# Patient Record
Sex: Female | Born: 1965 | Race: Black or African American | Hispanic: No | Marital: Married | Smoking: Current every day smoker
Health system: Southern US, Community
[De-identification: ages and names within clinical notes are randomized; demographics above are authoritative.]

## PROBLEM LIST (undated history)

## (undated) DIAGNOSIS — N281 Cyst of kidney, acquired: Secondary | ICD-10-CM

## (undated) HISTORY — PX: LAPAROSCOPIC DRAINAGE RENAL CYST: SUR764

---

## 1998-04-01 ENCOUNTER — Emergency Department (HOSPITAL_COMMUNITY): Admission: EM | Admit: 1998-04-01 | Discharge: 1998-04-01 | Payer: Self-pay | Admitting: Emergency Medicine

## 1999-06-26 ENCOUNTER — Emergency Department (HOSPITAL_COMMUNITY): Admission: EM | Admit: 1999-06-26 | Discharge: 1999-06-26 | Payer: Self-pay | Admitting: Emergency Medicine

## 1999-06-26 ENCOUNTER — Encounter: Payer: Self-pay | Admitting: Emergency Medicine

## 2000-01-23 ENCOUNTER — Other Ambulatory Visit: Admission: RE | Admit: 2000-01-23 | Discharge: 2000-01-23 | Payer: Self-pay | Admitting: Gynecology

## 2000-09-20 ENCOUNTER — Encounter: Payer: Self-pay | Admitting: *Deleted

## 2000-09-20 ENCOUNTER — Ambulatory Visit (HOSPITAL_COMMUNITY): Admission: RE | Admit: 2000-09-20 | Discharge: 2000-09-20 | Payer: Self-pay | Admitting: *Deleted

## 2001-06-10 ENCOUNTER — Other Ambulatory Visit: Admission: RE | Admit: 2001-06-10 | Discharge: 2001-06-10 | Payer: Self-pay | Admitting: Gynecology

## 2001-06-17 ENCOUNTER — Emergency Department (HOSPITAL_COMMUNITY): Admission: EM | Admit: 2001-06-17 | Discharge: 2001-06-17 | Payer: Self-pay | Admitting: Emergency Medicine

## 2004-04-11 ENCOUNTER — Other Ambulatory Visit: Admission: RE | Admit: 2004-04-11 | Discharge: 2004-04-11 | Payer: Self-pay | Admitting: Gynecology

## 2005-12-14 ENCOUNTER — Other Ambulatory Visit: Admission: RE | Admit: 2005-12-14 | Discharge: 2005-12-14 | Payer: Self-pay | Admitting: Gynecology

## 2006-12-23 ENCOUNTER — Emergency Department (HOSPITAL_COMMUNITY): Admission: EM | Admit: 2006-12-23 | Discharge: 2006-12-23 | Payer: Self-pay | Admitting: Emergency Medicine

## 2008-11-23 ENCOUNTER — Emergency Department (HOSPITAL_COMMUNITY): Admission: EM | Admit: 2008-11-23 | Discharge: 2008-11-24 | Payer: Self-pay | Admitting: Emergency Medicine

## 2008-12-17 ENCOUNTER — Encounter: Admission: RE | Admit: 2008-12-17 | Discharge: 2008-12-17 | Payer: Self-pay | Admitting: Urology

## 2009-02-02 ENCOUNTER — Ambulatory Visit (HOSPITAL_COMMUNITY): Admission: RE | Admit: 2009-02-02 | Discharge: 2009-02-02 | Payer: Self-pay | Admitting: Urology

## 2009-12-15 IMAGING — CT CT PELVIS W/O CM
3 of 4 series · 14 of 32 positions shown, 19 images · non-contrast
Comparison: None

CT ABDOMEN

CLINICAL DATA: Left flank pain.

CT ABDOMEN AND PELVIS WITHOUT CONTRAST
TECHNIQUE: Multidetector CT imaging of the abdomen and pelvis was
performed following the standard protocol without intravenous
contrast.

[Series 2: renal stone · axial · 0.79mm/px · z∈[-362,-122]mm · 4 of 81 slices shown, 9 images]
[im 17/81  soft-tissue]
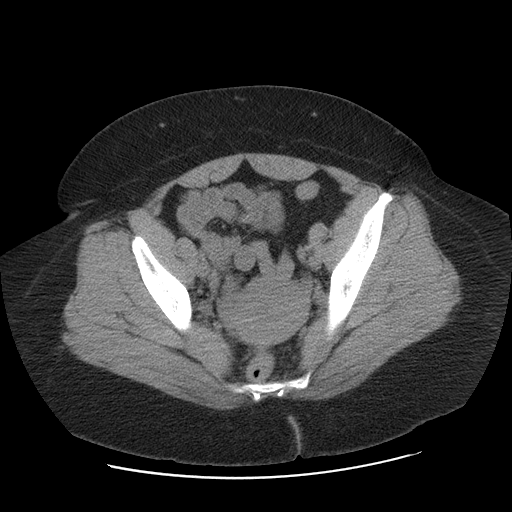
[im 17/81  lung]
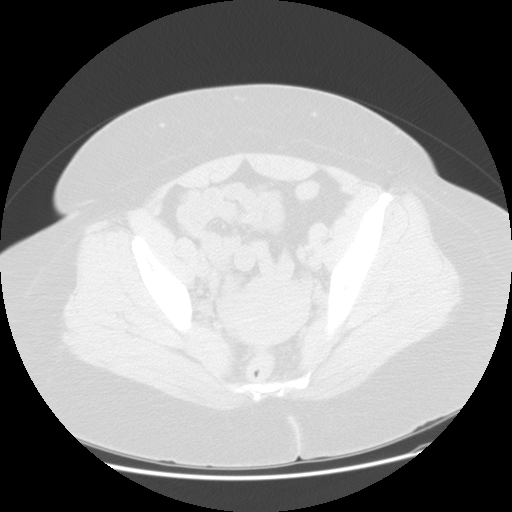
[im 17/81  bone]
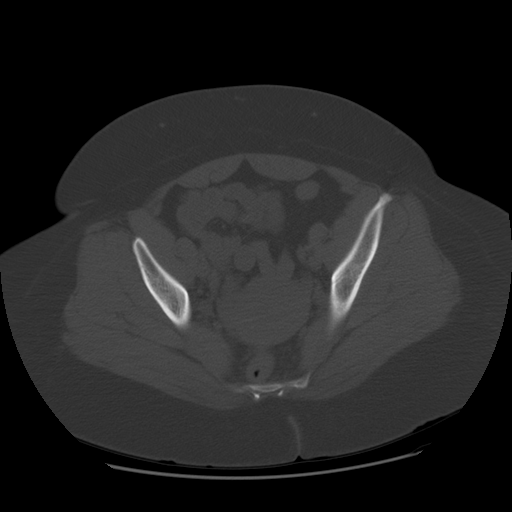
[im 33/81  soft-tissue]
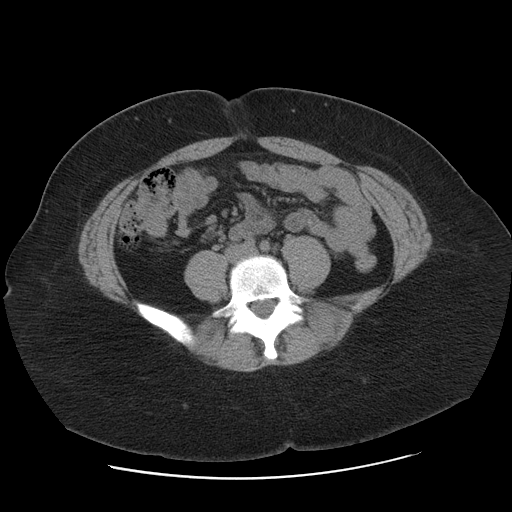
[im 33/81  lung]
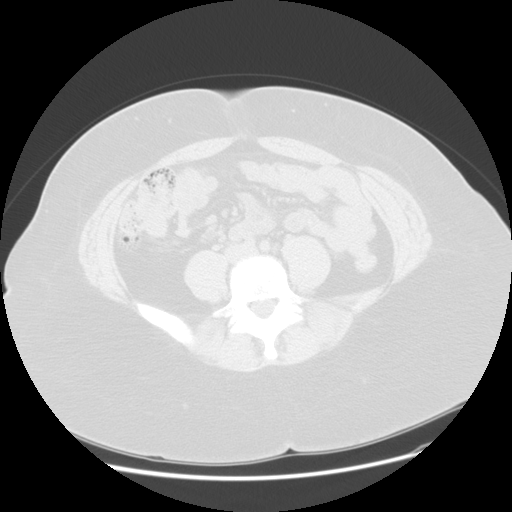
[im 49/81  soft-tissue]
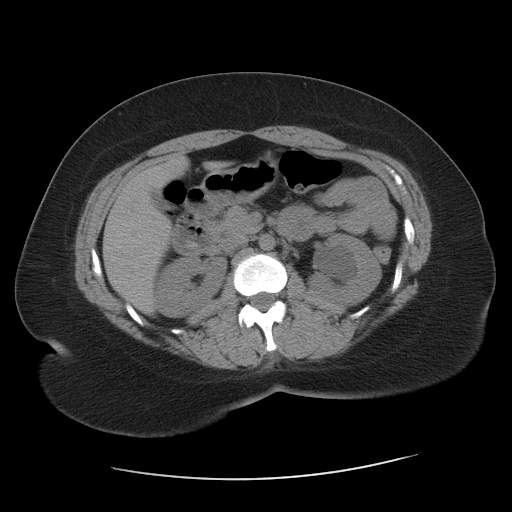
[im 49/81  lung]
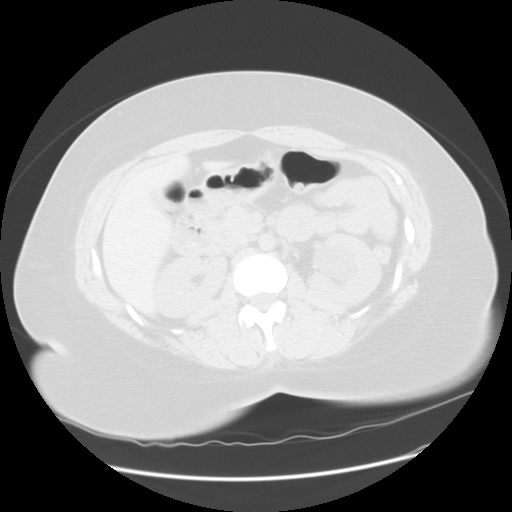
[im 65/81  soft-tissue]
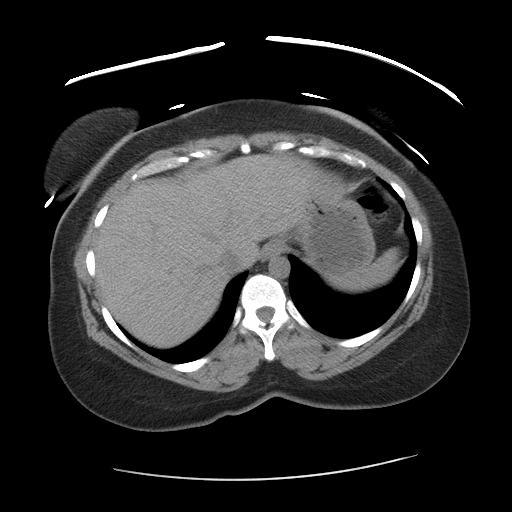
[im 65/81  lung]
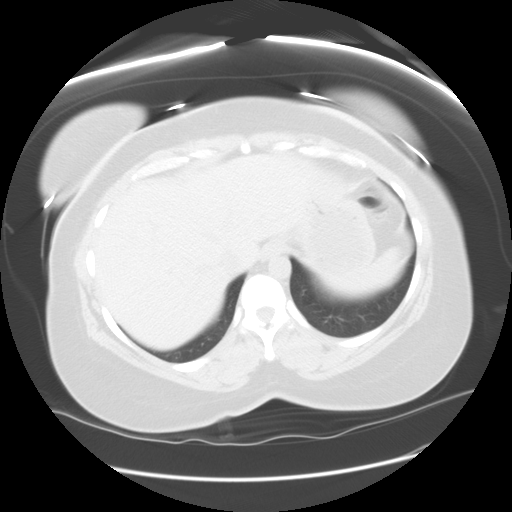

[Series 400: reformatted · sagittal · 0.87mm/px · 8 of 195 slices shown (1 of 2)]
[im 17/195  soft-tissue]
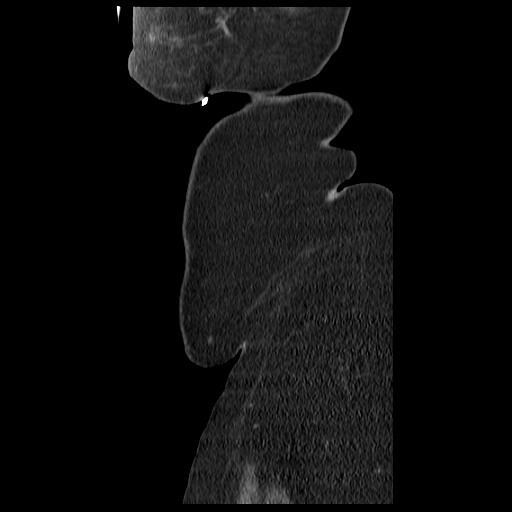
[im 49/195  soft-tissue]
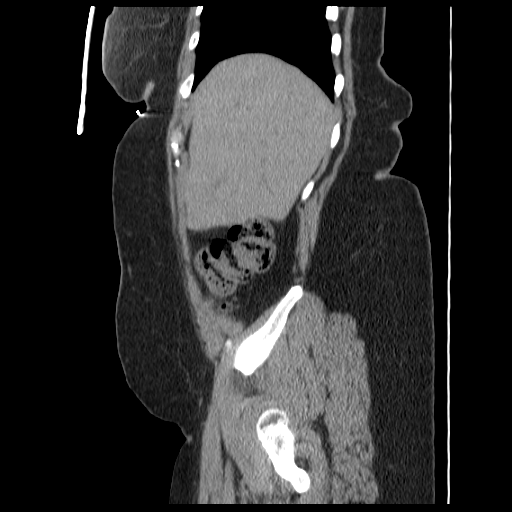
[im 65/195  soft-tissue]
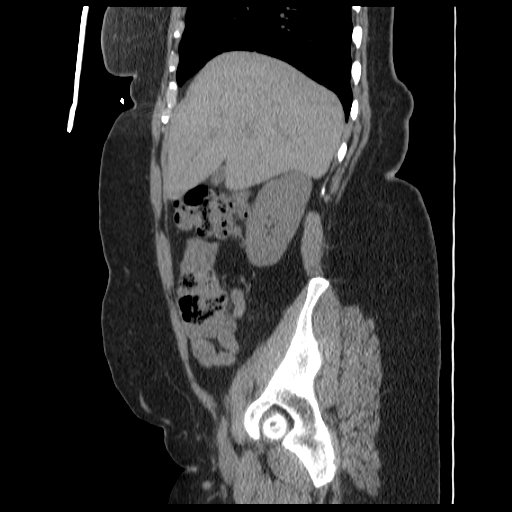
[im 81/195  soft-tissue]
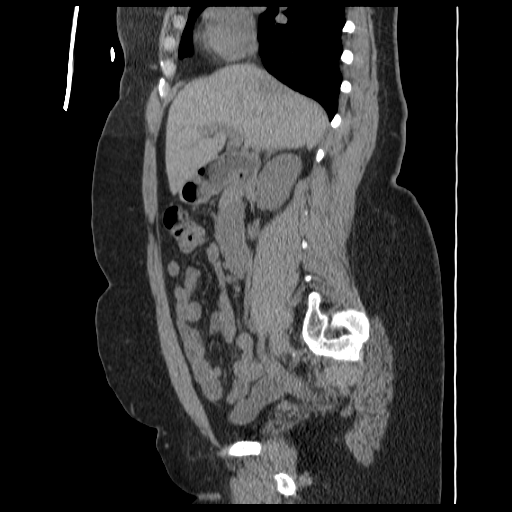
[im 114/195  soft-tissue]
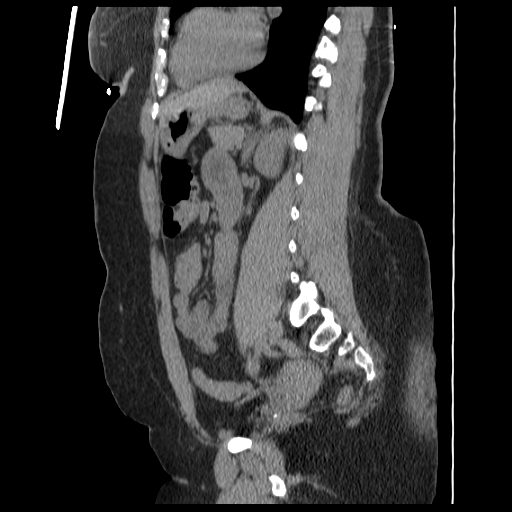
[im 130/195  soft-tissue]
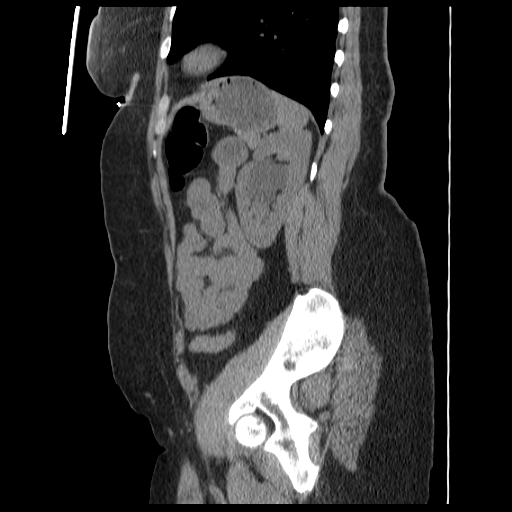
[im 146/195  soft-tissue]
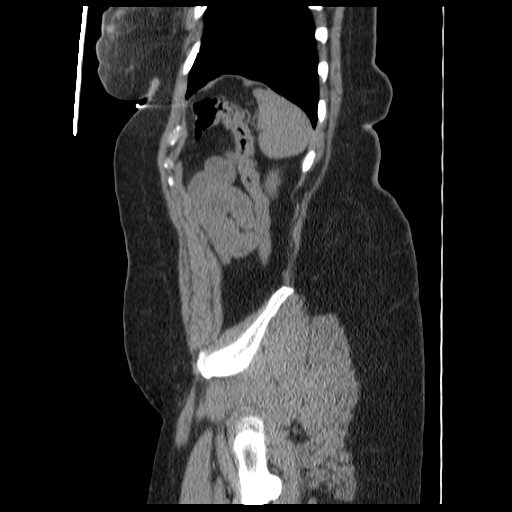
[im 178/195  soft-tissue]
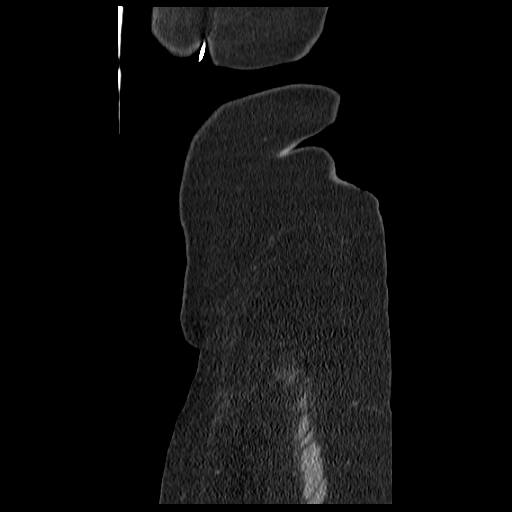

[Series 401: reformatted · coronal · 0.87mm/px · 2 of 171 slices shown (2 of 2)]
[im 18/171  soft-tissue]
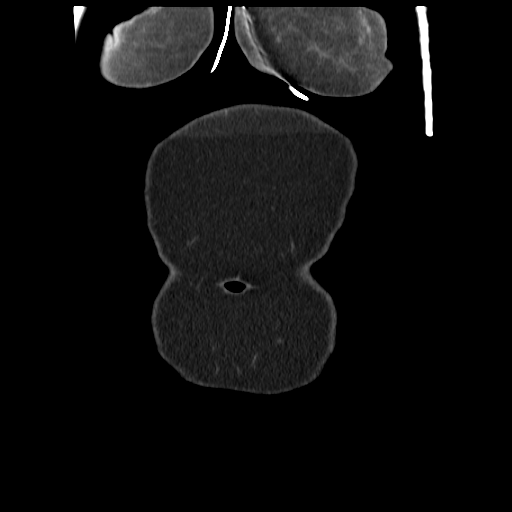
[im 35/171  soft-tissue]
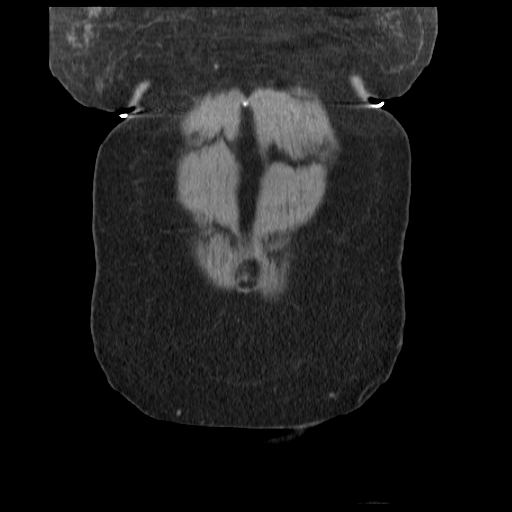

[14 of 32 positions shown; findings below may reference images not displayed]

FINDINGS: Visualized lung bases clear.  Unremarkable uninfused
evaluation of the liver, nondistended gallbladder, spleen, adrenal
glands, pancreas, small bowel, right kidney.  There is a 3.8 cm
fluid attenuation left parapelvic renal mass probably cyst but
incompletely characterized.  No hydronephrosis.  No
nephrolithiasis.
IMPRESSION: 1.  3.8 cm left parapelvic renal mass probably cyst but
incompletely characterized. Outpatient ultrasound or dedicated
renal MR Je be useful for confirmation.
2.  Negative for acute abdominal process.

CT PELVIS
FINDINGS: Normal appendix.  The colon is nondilated, unremarkable.
Bilateral pelvic phleboliths.  No definite distal ureteral
calculus.  Urinary bladder is nondistended.  Uterus and adnexal
regions unremarkable.  Asymmetric sacroiliitis, left greater than
right.  No free fluid.  No definite adenopathy.
IMPRESSION: 1.  No acute pelvic process.
2.  Normal appendix.

## 2010-09-02 LAB — PROTIME-INR: INR: 1 (ref 0.00–1.49)

## 2010-09-02 LAB — CBC
HCT: 42.4 % (ref 36.0–46.0)
Platelets: 253 10*3/uL (ref 150–400)
RDW: 13.9 % (ref 11.5–15.5)

## 2010-09-02 LAB — APTT: aPTT: 24 seconds (ref 24–37)

## 2010-09-05 LAB — URINE MICROSCOPIC-ADD ON

## 2010-09-05 LAB — DIFFERENTIAL
Basophils Absolute: 0.1 10*3/uL (ref 0.0–0.1)
Eosinophils Relative: 3 % (ref 0–5)
Lymphocytes Relative: 27 % (ref 12–46)

## 2010-09-05 LAB — URINALYSIS, ROUTINE W REFLEX MICROSCOPIC
Glucose, UA: NEGATIVE mg/dL
Leukocytes, UA: NEGATIVE
Nitrite: NEGATIVE
Protein, ur: NEGATIVE mg/dL
Urobilinogen, UA: 0.2 mg/dL (ref 0.0–1.0)

## 2010-09-05 LAB — WET PREP, GENITAL: Yeast Wet Prep HPF POC: NONE SEEN

## 2010-09-05 LAB — POCT PREGNANCY, URINE: Preg Test, Ur: NEGATIVE

## 2010-09-05 LAB — POCT I-STAT, CHEM 8
BUN: 14 mg/dL (ref 6–23)
Hemoglobin: 15.3 g/dL — ABNORMAL HIGH (ref 12.0–15.0)
Sodium: 140 mEq/L (ref 135–145)
TCO2: 26 mmol/L (ref 0–100)

## 2010-09-05 LAB — URINE CULTURE: Colony Count: >=100000

## 2010-09-05 LAB — CBC
HCT: 42.7 % (ref 36.0–46.0)
Platelets: 225 10*3/uL (ref 150–400)
RDW: 12.8 % (ref 11.5–15.5)

## 2013-03-23 ENCOUNTER — Emergency Department (INDEPENDENT_AMBULATORY_CARE_PROVIDER_SITE_OTHER)
Admission: EM | Admit: 2013-03-23 | Discharge: 2013-03-23 | Disposition: A | Discharge status: Home / Self Care | Payer: Self-pay | Source: Home / Self Care | Attending: Family Medicine | Admitting: Family Medicine

## 2013-03-23 ENCOUNTER — Encounter (HOSPITAL_COMMUNITY): Payer: Self-pay | Admitting: Emergency Medicine

## 2013-03-23 DIAGNOSIS — M7552 Bursitis of left shoulder: Secondary | ICD-10-CM

## 2013-03-23 DIAGNOSIS — M75100 Unspecified rotator cuff tear or rupture of unspecified shoulder, not specified as traumatic: Secondary | ICD-10-CM

## 2013-03-23 HISTORY — DX: Cyst of kidney, acquired: N28.1

## 2013-03-23 MED ORDER — METHYLPREDNISOLONE ACETATE 40 MG/ML IJ SUSP
INTRAMUSCULAR | Status: AC
  Filled 2013-03-23: qty 5

## 2013-03-23 NOTE — ED Notes (Signed)
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  Has tried IBU 800mg  and Tyl without relief.  Denies any parasthesias.

## 2013-03-23 NOTE — ED Provider Notes (Signed)
Medical screening examination/treatment/procedure(s) were performed by a resident physician or non-physician practitioner and as the supervising physician I was immediately available for consultation/collaboration.  Shyia Fillingim, MD    Louiza Moor S Tyeson Tanimoto, MD 03/23/13 1915 

## 2013-03-23 NOTE — ED Notes (Signed)
Pt called in lobby x 1 

## 2013-03-23 NOTE — ED Provider Notes (Signed)
CSN: [id number]     Arrival date & time 03/23/13  1632 History   First MD Initiated Contact with Patient 03/23/13 1744     Chief Complaint  Patient presents with  . 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 Renal cyst    Past Surgical History  Procedure Laterality Date  . Laparoscopic drainage renal cyst     No family history on file. History  Substance Use Topics  . Smoking status: Current Every Day Smoker -- 0.50 packs/day    Types: Cigarettes  . Smokeless tobacco: Not on file  . Alcohol Use: No   OB History   Grav Para Term Preterm Abortions TAB SAB Ect Mult Living                 Review of Systems  Allergies  Review of patient's allergies indicates no known allergies.  Home Medications  No current outpatient prescriptions on file. BP 145/98  Pulse 84  Temp(Src) 98.3 F (36.8 C) (Oral)  Resp 16  SpO2 100%  LMP 03/05/2013 Physical Exam  Gen: middle age female, well appearing, obese, pleasant  Shoulder Exam - left Inspection: normal Palpation:   Clavicle:  Non tender, no deformity  AC Joint: Non tender, no deformity  Scapula:  Non tender, no deformity             Biceps Tendon: Non tender, no deformity ROM/Strength:  Abduction (Suprapsinatus): normal  Internal Rotation/Liftoff (Subsapularis):normal  External Rotation (Infraspinatus/Teres Minor): limited on left by pain Maneuvers:   Neer's: Negative  Hawkin's: Positive  Empty Can/Jobes for supraspinatus: Negative Obrien's test for labrum tear: equivocal  Speed's: N/A Yergenson's: N/A     ED Course  Procedures (including critical care time) Labs  Review Labs Reviewed - No data to display Imaging Review No results found.    MDM   1. Subacromial bursitis, left    Aspiration/Injection Procedure Note Margy Sumler [id number] 1965/10/27  Procedure: Injection left subacromial bursa Indications: bursitis, pain and decreased ROM  Procedure Details Consent: Risks of procedure as well as the alternatives and risks of each were explained to the (patient/caregiver).  Consent for procedure obtained. Time Out: Verified patient identification, verified procedure, site/side was marked, verified correct patient position, special equipment/implants available, medications/allergies/relevent history reviewed, required imaging and test results available.  Performed   Local Anesthesia Used:Ethyl Chloride Spray Approach: Posterior Injection: 2 ml 2% lidocaine and 40 mg depomedrol A sterile dressing was applied.  Patient did tolerate procedure well. Estimated blood loss: < 5 ml  Mat Carne 03/23/2013, 6:33 PM    Garnetta Buddy, MD 03/23/13 [phone]

## 2015-09-30 ENCOUNTER — Ambulatory Visit (INDEPENDENT_AMBULATORY_CARE_PROVIDER_SITE_OTHER): Payer: Self-pay | Admitting: Family Medicine

## 2015-09-30 DIAGNOSIS — Z6841 Body Mass Index (BMI) 40.0 and over, adult: Secondary | ICD-10-CM | POA: Insufficient documentation

## 2015-09-30 DIAGNOSIS — Z021 Encounter for pre-employment examination: Secondary | ICD-10-CM

## 2015-09-30 DIAGNOSIS — Z0000 Encounter for general adult medical examination without abnormal findings: Secondary | ICD-10-CM

## 2015-09-30 NOTE — Progress Notes (Signed)
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 She has not taken any medicine, does not smoke, has no significant past medical history. Aref objective:  Objective: Morbidly obese middle-aged woman in no acute distress BP 130/88 mmHg  Pulse 97  Temp(Src) 98 F (36.7 C) (Oral)  Resp 17  Ht 5' 0.75" (1.543 m)  Wt 287 lb 6.4 oz (130.364 kg)  BMI 54.76 kg/m2  SpO2 97%  LMP 09/20/2015 HEENT: Unremarkable Chest: Clear Heart: Regular no murmur Abdomen: Protuberant, no HSM or masses palpated, no guarding or rebound Extremities: Good pedal pulses and no edema, full range of motion of all 4 extremities Skin: No rash  Assessment: Patient qualifies for 2 year pass  Signed, Sheila OatsKurt Chia Mowers M.D.

## 2017-09-12 ENCOUNTER — Encounter: Payer: Self-pay | Admitting: Physician Assistant

## 2017-09-12 ENCOUNTER — Ambulatory Visit: Payer: Self-pay | Admitting: Physician Assistant

## 2017-09-12 ENCOUNTER — Other Ambulatory Visit: Payer: Self-pay

## 2017-09-12 VITALS — BP 122/72 | HR 97 | Resp 16 | Ht 60.75 in | Wt 295.0 lb

## 2017-09-12 DIAGNOSIS — F17200 Nicotine dependence, unspecified, uncomplicated: Secondary | ICD-10-CM | POA: Insufficient documentation

## 2017-09-12 DIAGNOSIS — Z024 Encounter for examination for driving license: Secondary | ICD-10-CM

## 2017-09-12 NOTE — Progress Notes (Signed)
This patient presents for DOT examination for fitness for duty.   Medical History:  1. Head/Brain Injuries, disorders or illnesses no  2. Seizures, epilepsy no  3. Eye disorders or impaired vision (except corrective lenses) no  4. Ear disorders, loss of hearing or balance no  5. Heart disease or heart attack, other cardiovascular condition no  6. Heart surgery (valve replacement/bypass, angioplasty, pacemaker/defribrillator) no  7. High blood pressure no  8. High cholesterol no  9. Chronic cough, shortness of breath or other breathing problems no  10. Lung disease (emphysema, asthma or chronic bronchitis) no  11. Kidney disease, dialysis no  12. Digestive problems  no  13. Diabetes or elevated blood sugar no                      If yes to #13, Insulin use n/a  14. Nervious or psychiatric disorders, e.g., severe depression no  15. Fainting or syncope no  16. Dizziness, headaches, numbness, tingling or memory loss no  17. Unexplained weight loss no  18. Stroke, TIA or paralysis no  19. Missing or impaired hand, arm, foot, leg, finger, toe no  20. Spinal injury or disease no  21. Bone, muscles or nerve problems no  22. Blood clots or bleeding bleeding disorders no  23. Cancer no  24. Chronic infection or other chronic diseases no  25. Sleep disorders, pauses in breathing while asleep, daytime sleepiness, loud snoring Loud snoring, no apnea, awakens rested  26. Have you ever had a sleep test? no  27.  Have you ever spent a night in the hospital? no  28. Have you ever had a broken bone? yes  29. Have you or or do you use tobacco products? yes  30. Regular, frequent alcohol use no  31. Illegal substance use within the past 2 years no  32.  Have you ever failed a drug test or been dependent on an illegal substance? no   Current Medications: Prior to Admission medications   Not on File    Medical Examiner's Comments on Health History:  28. As a teenager, RIGHT index  finger. No residual effects, deficits. 29. Smoker. 1/2 PPD.  TESTING:   Visual Acuity Screening   Right eye Left eye Both eyes  Without correction: 20/25 20/25 20/13   With correction:       Monocular Vision: No.  Hearing Aid used for test: No. Hearing Aid required to to meet standard: No.  BP 122/72   Pulse 97   Resp 16   Ht 5' 0.75" (1.543 m)   Wt 295 lb (133.8 kg)   BMI 56.20 kg/m  Pulse rate is regular   UA: SG 1.025, Protein NEG, Blood SMALL, Sugar NEGATIVE  PHYSICAL EXAMINATION:  General Appearance Not markedly obese. No tremor, signs of alcoholism, problem drinking or drug abuse.   Skin Warm, dry and intact. Scars (n/a), Tattoos (n/a)  Eyes Pupils are equal, round and reactive to light and accommodation, extraocular movements are intact. No exophthalmos, no nystagmus. Evidence of cataracts, retinopathy, macular degeneration, aphakia, glaucoma may need referral to a specialist.  Ears Normal external ears. External canal without occlusion. No scarring of the TM. No perforation of the TM.  Mouth and Throat Clear and moist. No irremedial deformities likely to interfere with breathing or swallowing.  Heart No murmurs, extra sounds, evidence of cardiomegaly. No pacemaker. No implantable defibrillator.  Lungs and Chest (excluding breasts) Normal chest expansion, respiratory rate, breath sounds.  No cyanosis.  Abdomen and Viscera No liver enlargement. No splenic enlargement. No masses, bruits, hernias or significant abdominal wall weakness.  Genitourinary  No inguinal or femoral hernia.  Spine and other musculoskeletal No tenderness, no limitation of motion, no deformities. No evidence of previous surgery.  Extremities No loss or impairment of leg, foot, toe, arm, hand, finger. No perceptible limp, deformities, atrophy, weakness, paralysis, clubbing, edema, hypotonia. Patient has sufficient grasp and prehension to maintain steering wheel grip. Patient has sufficient mobility and  strength in the lower limbs to operate pedals properly.  Neurologic Normal equilibrium, coordination, speech pattern. No paresthesia, asymmetry of deep tendon reflexes, sensory or positional abnormalities. No abnormality of patellar or Babinski's reflexes.  Gait Not antalgic or ataxic  Vascular Normal pulses. No carotid or arterial bruits. No varicose veins.     Certification Status: does meet standards for 2 year certificate.  Wearing corrective lenses: no Wearing hearing aid: no Accompanied by a n/a waiver/exemption Skill performance Evaluation (SPE) Certificate: no Driving within an exempt intracity zone: no Qualified by operation of 49 CFR 391.64: no  Certification expires 09/13/2019

## 2017-09-12 NOTE — Patient Instructions (Addendum)
Schedule a wellness exam at your convenience, either here with Dr. Leretha PolSantiago, PA [place] [facility] or [place], or [facility]    IF you received an x-ray today, you will receive an invoice from [facility]. Please contact [facility] at [phone] with questions or concerns regarding your invoice.   IF you received labwork today, you will receive an invoice from [facility]. Please contact [facility] at [phone] with questions or concerns regarding your invoice.   Our billing staff will not be able to assist you with questions regarding bills from these companies.  You will be contacted with the lab results as soon as they are available. The fastest way to get your results is to activate your My Chart account. Instructions are located on the last page of this paperwork. If you have not heard from us regarding the results in 2 weeks, please contact this office.
